# Patient Record
Sex: Female | Born: 1995 | Race: White | Hispanic: No | Marital: Single | Smoking: Never smoker
Health system: Southern US, Community
[De-identification: ages and names within clinical notes are randomized; demographics above are authoritative.]

## PROBLEM LIST (undated history)

## (undated) DIAGNOSIS — Z789 Other specified health status: Secondary | ICD-10-CM

## (undated) HISTORY — PX: TYMPANOSTOMY TUBE PLACEMENT: SHX32

---
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
## 2015-11-13 NOTE — L&D Delivery Note (Signed)
Delivery Note At 4:36 PM a viable and healthy female was delivered via Vaginal, Spontaneous Delivery (Presentation:OA ;ROT  ).  APGAR: 9, 9; weight P .   Placenta status: delivered, intact .  Cord: 3V with the following complications: none.    Anesthesia:  epidural Episiotomy: None Lacerations: None;B Labial Suture Repair: 3.0 vicryl rapide Est. Blood Loss (mL): 300  Mom to postpartum.  Baby to Couplet care / Skin to Skin.  [name], Tami Viviani 10/08/2016, 4:50 PM  [facility]

## 2016-03-28 LAB — OB RESULTS CONSOLE RUBELLA ANTIBODY, IGM: RUBELLA: IMMUNE

## 2016-03-28 LAB — OB RESULTS CONSOLE HIV ANTIBODY (ROUTINE TESTING): HIV: NONREACTIVE

## 2016-03-28 LAB — OB RESULTS CONSOLE HEPATITIS B SURFACE ANTIGEN: HEP B S AG: NEGATIVE

## 2016-03-28 LAB — OB RESULTS CONSOLE GC/CHLAMYDIA
Chlamydia: NEGATIVE
Gonorrhea: NEGATIVE

## 2016-03-28 LAB — OB RESULTS CONSOLE RPR: RPR: NONREACTIVE

## 2016-08-08 LAB — OB RESULTS CONSOLE ANTIBODY SCREEN: ANTIBODY SCREEN: NEGATIVE

## 2016-10-06 ENCOUNTER — Encounter (HOSPITAL_COMMUNITY): Payer: Self-pay | Admitting: *Deleted

## 2016-10-06 ENCOUNTER — Inpatient Hospital Stay (HOSPITAL_COMMUNITY)
Admission: AD | Admit: 2016-10-06 | Discharge: 2016-10-10 | DRG: 775 | Disposition: A | Discharge status: Home / Self Care | Payer: Medicaid Other | Source: Ambulatory Visit | Attending: Obstetrics and Gynecology | Admitting: Obstetrics and Gynecology

## 2016-10-06 ENCOUNTER — Inpatient Hospital Stay (HOSPITAL_COMMUNITY): Payer: Medicaid Other

## 2016-10-06 DIAGNOSIS — N132 Hydronephrosis with renal and ureteral calculous obstruction: Secondary | ICD-10-CM | POA: Diagnosis present

## 2016-10-06 DIAGNOSIS — N200 Calculus of kidney: Secondary | ICD-10-CM | POA: Diagnosis not present

## 2016-10-06 DIAGNOSIS — Z3A36 36 weeks gestation of pregnancy: Secondary | ICD-10-CM

## 2016-10-06 DIAGNOSIS — O26833 Pregnancy related renal disease, third trimester: Secondary | ICD-10-CM | POA: Diagnosis present

## 2016-10-06 DIAGNOSIS — O26893 Other specified pregnancy related conditions, third trimester: Secondary | ICD-10-CM | POA: Diagnosis present

## 2016-10-06 DIAGNOSIS — Z6791 Unspecified blood type, Rh negative: Secondary | ICD-10-CM

## 2016-10-06 DIAGNOSIS — R109 Unspecified abdominal pain: Secondary | ICD-10-CM | POA: Diagnosis present

## 2016-10-06 HISTORY — DX: Other specified health status: Z78.9

## 2016-10-06 LAB — URINALYSIS, ROUTINE W REFLEX MICROSCOPIC
Bilirubin Urine: NEGATIVE
Glucose, UA: NEGATIVE mg/dL
Ketones, ur: NEGATIVE mg/dL
Leukocytes, UA: NEGATIVE
Nitrite: NEGATIVE
Protein, ur: NEGATIVE mg/dL
Specific Gravity, Urine: 1.015 (ref 1.005–1.030)
pH: 7.5 (ref 5.0–8.0)

## 2016-10-06 LAB — GROUP B STREP BY PCR: GROUP B STREP BY PCR: NEGATIVE

## 2016-10-06 LAB — COMPREHENSIVE METABOLIC PANEL WITH GFR
ALT: 15 U/L (ref 14–54)
AST: 20 U/L (ref 15–41)
Albumin: 3.1 g/dL — ABNORMAL LOW (ref 3.5–5.0)
Alkaline Phosphatase: 100 U/L (ref 38–126)
Anion gap: 9 (ref 5–15)
BUN: 11 mg/dL (ref 6–20)
CO2: 21 mmol/L — ABNORMAL LOW (ref 22–32)
Calcium: 8.3 mg/dL — ABNORMAL LOW (ref 8.9–10.3)
Chloride: 108 mmol/L (ref 101–111)
Creatinine, Ser: 0.97 mg/dL (ref 0.44–1.00)
GFR calc Af Amer: >60 mL/min
GFR calc non Af Amer: >60 mL/min
Glucose, Bld: 79 mg/dL (ref 65–99)
Potassium: 3.7 mmol/L (ref 3.5–5.1)
Sodium: 138 mmol/L (ref 135–145)
Total Bilirubin: 0.3 mg/dL (ref 0.3–1.2)
Total Protein: 6.6 g/dL (ref 6.5–8.1)

## 2016-10-06 LAB — CBC
HCT: 35 % — ABNORMAL LOW (ref 36.0–46.0)
Hemoglobin: 11.9 g/dL — ABNORMAL LOW (ref 12.0–15.0)
MCH: 33.1 pg (ref 26.0–34.0)
MCHC: 34 g/dL (ref 30.0–36.0)
MCV: 97.2 fL (ref 78.0–100.0)
Platelets: 204 K/uL (ref 150–400)
RBC: 3.6 MIL/uL — ABNORMAL LOW (ref 3.87–5.11)
RDW: 13 % (ref 11.5–15.5)
WBC: 14.9 K/uL — ABNORMAL HIGH (ref 4.0–10.5)

## 2016-10-06 LAB — URINE MICROSCOPIC-ADD ON

## 2016-10-06 MED ORDER — ONDANSETRON HCL 4 MG/2ML IJ SOLN
4.0000 mg | Freq: Four times a day (QID) | INTRAMUSCULAR | Status: DC
  Administered 2016-10-06 – 2016-10-07 (×4): 4 mg via INTRAVENOUS
  Filled 2016-10-06 (×4): qty 2

## 2016-10-06 MED ORDER — HYDROMORPHONE HCL 1 MG/ML IJ SOLN
1.0000 mg | Freq: Once | INTRAMUSCULAR | Status: AC
  Administered 2016-10-06: 1 mg via INTRAMUSCULAR
  Filled 2016-10-06: qty 1

## 2016-10-06 MED ORDER — HYDROMORPHONE HCL 1 MG/ML IJ SOLN
2.0000 mg | INTRAMUSCULAR | Status: DC | PRN
  Administered 2016-10-06 – 2016-10-07 (×5): 2 mg via INTRAVENOUS
  Filled 2016-10-06 (×5): qty 2

## 2016-10-06 MED ORDER — HYDROMORPHONE HCL 1 MG/ML IJ SOLN
1.0000 mg | INTRAMUSCULAR | Status: DC | PRN
  Administered 2016-10-06: 1 mg via INTRAVENOUS
  Filled 2016-10-06: qty 1

## 2016-10-06 MED ORDER — ACETAMINOPHEN 325 MG PO TABS: 650.0000 mg | ORAL_TABLET | ORAL | Status: DC | PRN

## 2016-10-06 MED ORDER — ZOLPIDEM TARTRATE 5 MG PO TABS
5.0000 mg | ORAL_TABLET | Freq: Every evening | ORAL | Status: DC | PRN
  Administered 2016-10-08: 5 mg via ORAL
  Filled 2016-10-06: qty 1

## 2016-10-06 MED ORDER — LACTATED RINGERS IV SOLN
INTRAVENOUS | Status: DC
  Administered 2016-10-06 – 2016-10-07 (×2): via INTRAVENOUS

## 2016-10-06 NOTE — MAU Note (Addendum)
Pt stated she woke up with a sharp pain in her left lower back that is constant and will not go away. SHe is also c/o cramping and ctx that come and go in her abd. Good fetal movement reported and denies vag bleeding or leaking. Has been vomiting off and on all morning.

## 2016-10-06 NOTE — Progress Notes (Signed)
Patient ID: Tami [name], female   DOB: 04-Oct-1996, 20 y.o.   MRN: [id number] Pt admitted and in room, has been about 2 hours since last dilaudid (1 mg) and starting to hurt again, will increase to 2mg  IV q 3 hours prn.  Had another episode of emesis, but feels hungry now and wants to try to eat something.  Will try clears.  NPO after midnight. Informed pt of plan and that Dr. Retta Dionesahlstedt would evaluate her in AM and discuss options. Her boyfriend has had a stent for kidney stones before so is familiar with the process.

## 2016-10-06 NOTE — MAU Provider Note (Signed)
History     CSN: [id number]  Arrival date and time: 10/06/16 1431   First Provider Initiated Contact with Patient 10/06/16 1632      Chief Complaint  Patient presents with  . Back Pain  . Contractions   Back Pain  This is a new problem. The current episode started today. The problem occurs constantly. The problem is unchanged. The pain is present in the lumbar spine. The pain does not radiate. The pain is at a severity of 9/10. Pertinent negatives include no abdominal pain, dysuria or fever. Risk factors include pregnancy. Treatments tried: tylenol, but vomited after     Past Medical History:  Diagnosis Date  . Medical history non-contributory     Past Surgical History:  Procedure Laterality Date  . TYMPANOSTOMY TUBE PLACEMENT      History reviewed. No pertinent family history.  Social History  Substance Use Topics  . Smoking status: Never Smoker  . Smokeless tobacco: Never Used  . Alcohol use No    Allergies:  Allergies  Allergen Reactions  . Sulfur Rash    Prescriptions Prior to Admission  Medication Sig Dispense Refill Last Dose  . IRON PO Take 1 tablet by mouth daily.   10/05/2016  . Prenatal Vit-Fe Fumarate-FA (MULTIVITAMIN-PRENATAL) 27-0.8 MG TABS tablet Take 1 tablet by mouth daily at 12 noon.   10/05/2016    Review of Systems  Constitutional: Negative for chills and fever.  Gastrointestinal: Positive for nausea and vomiting. Negative for abdominal pain, constipation and diarrhea.  Genitourinary: Positive for flank pain. Negative for dysuria, frequency and urgency.  Musculoskeletal: Positive for back pain.   Physical Exam   Blood pressure 120/80, pulse 78, temperature 97.8 F (36.6 C), temperature source Oral, resp. rate 18.  Physical Exam  Nursing note and vitals reviewed. Constitutional: She is oriented to person, place, and time. She appears well-developed and well-nourished. No distress.  HENT:  Head: Normocephalic.  Cardiovascular: Normal  rate.   Respiratory: Effort normal.  GI: Soft. There is no tenderness. There is no rebound.  Genitourinary:  Genitourinary Comments: CVA tenderness on the left No CVA tenderness on the right.   Neurological: She is alert and oriented to person, place, and time.  Skin: Skin is warm and dry.  Psychiatric: She has a normal mood and affect.    FHT: 135, moderate with 15x15 accels, no decels Toco: some UI  Results for orders placed or performed during the hospital encounter of 10/06/16 (from the past 24 hour(s))  Urinalysis, Routine w reflex microscopic ([facility])     Status: Abnormal   Collection Time: 10/06/16  2:48 PM  Result Value Ref Range   Color, Urine YELLOW YELLOW   APPearance HAZY (A) CLEAR   Specific Gravity, Urine 1.015 1.005 - 1.030   pH 7.5 5.0 - 8.0   Glucose, UA NEGATIVE NEGATIVE mg/dL   Hgb urine dipstick TRACE (A) NEGATIVE   Bilirubin Urine NEGATIVE NEGATIVE   Ketones, ur NEGATIVE NEGATIVE mg/dL   Protein, ur NEGATIVE NEGATIVE mg/dL   Nitrite NEGATIVE NEGATIVE   Leukocytes, UA NEGATIVE NEGATIVE  Urine microscopic-add on     Status: Abnormal   Collection Time: 10/06/16  2:48 PM  Result Value Ref Range   Squamous Epithelial / LPF 0-5 (A) NONE SEEN   WBC, UA 0-5 0 - 5 WBC/hpf   RBC / HPF 0-5 0 - 5 RBC/hpf   Bacteria, UA FEW (A) NONE SEEN  CBC     Status: Abnormal  Collection Time: 10/06/16  4:26 PM  Result Value Ref Range   WBC 14.9 (H) 4.0 - 10.5 K/uL   RBC 3.60 (L) 3.87 - 5.11 MIL/uL   Hemoglobin 11.9 (L) 12.0 - 15.0 g/dL   HCT 14.735.0 (L) 82.936.0 - 56.246.0 %   MCV 97.2 78.0 - 100.0 fL   MCH 33.1 26.0 - 34.0 pg   MCHC 34.0 30.0 - 36.0 g/dL   RDW 13.013.0 86.511.5 - 78.415.5 %   Platelets 204 150 - 400 K/uL  Comprehensive metabolic panel     Status: Abnormal   Collection Time: 10/06/16  4:26 PM  Result Value Ref Range   Sodium 138 135 - 145 mmol/L   Potassium 3.7 3.5 - 5.1 mmol/L   Chloride 108 101 - 111 mmol/L   CO2 21 (L) 22 - 32 mmol/L   Glucose, Bld 79 65 - 99  mg/dL   BUN 11 6 - 20 mg/dL   Creatinine, Ser 6.960.97 0.44 - 1.00 mg/dL   Calcium 8.3 (L) 8.9 - 10.3 mg/dL   Total Protein 6.6 6.5 - 8.1 g/dL   Albumin 3.1 (L) 3.5 - 5.0 g/dL   AST 20 15 - 41 U/L   ALT 15 14 - 54 U/L   Alkaline Phosphatase 100 38 - 126 U/L   Total Bilirubin 0.3 0.3 - 1.2 mg/dL   GFR calc non Af Amer >60 >60 mL/min   GFR calc Af Amer >60 >60 mL/min   Anion gap 9 5 - 15   [place] Renal  Result Date: 10/06/2016 CLINICAL DATA:  Acute onset of left flank pain. Trace hematuria, with minimal bacteria in the urine. Initial encounter. EXAM: RENAL / URINARY TRACT ULTRASOUND COMPLETE COMPARISON:  None. FINDINGS: Right Kidney: Length: 11.3 cm. Echogenicity within normal limits. Moderate right-sided hydronephrosis is noted. No mass visualized. Left Kidney: Length: 11.7 cm. Echogenicity within normal limits. Moderate left-sided hydronephrosis is noted. There appears to be a large 9 mm stone at the proximal left ureter, just below the left ureteropelvic junction. No mass visualized. Bladder: Appears normal for degree of bladder distention. IMPRESSION: Moderate bilateral hydronephrosis noted. Left-sided hydronephrosis appears to be caused by a large obstructing 9 mm stone at the proximal left ureter, just below the left ureteropelvic junction. Right-sided hydronephrosis may reflect hydronephrosis of pregnancy. The patient's symptoms are replicated while scanning the left kidney. Electronically Signed   By: Roanna RaiderJeffery  Chang M.D.   On: 10/06/2016 18:21   MAU Course  Procedures  MDM 1826: D/W Dr. Senaida Oresichardson, will admit to ante 1830: D/W NICU, ok with admission.   Assessment and Plan   1. Nephrolithiasis   2. [redacted] weeks gestation of pregnancy    Admit to antenatal  Tawnya CrookHogan, Heather Donovan 10/06/2016, 4:35 PM

## 2016-10-06 NOTE — Progress Notes (Signed)
Dr. Senaida Oresichardson at bedside let pt eat and give pain medicine in 30 mins.

## 2016-10-06 NOTE — H&P (Signed)
Tami [name] is a 20 y.o. female G1P0 at 5636 3/7 weeks (EDD 10/31/16 by LMP c/[name] 9 week US) presenting for severe left flank pain and nausea/vomiting. Pt states pain started early this AM and has gotten worse over the course of the day.  Renal US shows 9mm obstructing stone in the left ureter just below the UPJ and moderate hydronephrosis.   Prenatal care has been otherwise significant for missed appointments from 9 weeks-21 weeks but regular in her attendance after that point.  Rh negative and received rhogam.    OB History    Gravida Para Term Preterm AB Living   1             SAB TAB Ectopic Multiple Live Births                 Past Medical History:  Diagnosis Date  . Medical history non-contributory    Past Surgical History:  Procedure Laterality Date  . TYMPANOSTOMY TUBE PLACEMENT     Family History: family history is not on file. Social History:  reports that she has never smoked. She has never used smokeless tobacco. She reports that she does not drink alcohol or use drugs.     Maternal Diabetes: No Genetic Screening: not done due to missed appts Maternal Ultrasounds/Referrals: Normal Fetal Ultrasounds or other Referrals:  None Maternal Substance Abuse:  No Significant Maternal Medications:  None Significant Maternal Lab Results:  Lab values include: Rh negative Other Comments:  None  ROS History Dilation: 1 Effacement (%): Thick Station: Ballotable Exam by:: K.WIlosn,RN Blood pressure 120/80, pulse 78, temperature 97.8 F (36.6 C), temperature source Oral, resp. rate 18. Exam Physical Exam  Prenatal labs: ABO, Rh:  O negative Antibody:  negative Rubella:  Immune RPR:   NR HBsAg:   Neg HIV:   NR GBS:   pending Hgb AA Essential panel negative One hour GCT 100  Assessment/Plan: Pt admitted with obstructing left kidney stone 9mm just below the UPJ. Will manage pain and associated N/V with dilaudid and zofran prn.   Will collect GBS as not yet done in  office. D/[name] Dr. Retta Dionesahlstedt of urology and he will consult on patient in AM 10/07/16 to see if will require intervention.  Asks that patient be made NPO after midnight.  Tami [name] 10/06/2016, 6:50 PM

## 2016-10-07 ENCOUNTER — Encounter (HOSPITAL_COMMUNITY): Payer: Self-pay | Admitting: Anesthesiology

## 2016-10-07 ENCOUNTER — Encounter (HOSPITAL_COMMUNITY)
Admission: AD | Disposition: A | Discharge status: Home / Self Care | Payer: Self-pay | Source: Ambulatory Visit | Attending: Obstetrics and Gynecology

## 2016-10-07 ENCOUNTER — Inpatient Hospital Stay (HOSPITAL_COMMUNITY): Payer: Medicaid Other | Admitting: Anesthesiology

## 2016-10-07 HISTORY — PX: CYSTOSCOPY W/ URETERAL STENT PLACEMENT: SHX1429

## 2016-10-07 LAB — CBC
HEMATOCRIT: 32.4 % — AB (ref 36.0–46.0)
Hemoglobin: 11.2 g/dL — ABNORMAL LOW (ref 12.0–15.0)
MCH: 33.3 pg (ref 26.0–34.0)
MCHC: 34.6 g/dL (ref 30.0–36.0)
MCV: 96.4 fL (ref 78.0–100.0)
PLATELETS: 208 10*3/uL (ref 150–400)
RBC: 3.36 MIL/uL — AB (ref 3.87–5.11)
RDW: 13.3 % (ref 11.5–15.5)
WBC: 14.5 10*3/uL — AB (ref 4.0–10.5)

## 2016-10-07 SURGERY — CYSTOSCOPY, WITH RETROGRADE PYELOGRAM AND URETERAL STENT INSERTION
Anesthesia: Spinal | Site: Ureter | Laterality: Left

## 2016-10-07 MED ORDER — HYDROMORPHONE HCL 1 MG/ML IJ SOLN
1.0000 mg | Freq: Once | INTRAMUSCULAR | Status: AC
  Administered 2016-10-07: 1 mg via INTRAVENOUS

## 2016-10-07 MED ORDER — HYDROMORPHONE HCL 1 MG/ML IJ SOLN
INTRAMUSCULAR | Status: AC
  Filled 2016-10-07: qty 1

## 2016-10-07 MED ORDER — CEFAZOLIN SODIUM-DEXTROSE 2-4 GM/100ML-% IV SOLN
2.0000 g | Freq: Three times a day (TID) | INTRAVENOUS | Status: DC
  Administered 2016-10-07: 2 g via INTRAVENOUS
  Filled 2016-10-07 (×3): qty 100

## 2016-10-07 MED ORDER — LACTATED RINGERS IV BOLUS (SEPSIS): 500.0000 mL | Freq: Once | INTRAVENOUS | Status: DC

## 2016-10-07 MED ORDER — IOHEXOL 300 MG/ML  SOLN
INTRAMUSCULAR | Status: DC | PRN
  Administered 2016-10-07: 16:00:00

## 2016-10-07 MED ORDER — ONDANSETRON HCL 4 MG/2ML IJ SOLN
INTRAMUSCULAR | Status: DC | PRN
  Administered 2016-10-07: 4 mg via INTRAVENOUS

## 2016-10-07 MED ORDER — METOCLOPRAMIDE HCL 5 MG/ML IJ SOLN
INTRAMUSCULAR | Status: AC
  Filled 2016-10-07: qty 2

## 2016-10-07 MED ORDER — BUPIVACAINE IN DEXTROSE 0.75-8.25 % IT SOLN
INTRATHECAL | Status: DC | PRN
  Administered 2016-10-07: 1.6 mL via INTRATHECAL

## 2016-10-07 MED ORDER — METOCLOPRAMIDE HCL 5 MG/ML IJ SOLN: 10.0000 mg | Freq: Once | INTRAMUSCULAR | Status: DC | PRN

## 2016-10-07 MED ORDER — HYDROMORPHONE HCL 1 MG/ML IJ SOLN: 0.2500 mg | INTRAMUSCULAR | Status: DC | PRN

## 2016-10-07 MED ORDER — MEPERIDINE HCL 50 MG/ML IJ SOLN
6.2500 mg | INTRAMUSCULAR | Status: DC | PRN
  Filled 2016-10-07: qty 1

## 2016-10-07 MED ORDER — LACTATED RINGERS IV SOLN: INTRAVENOUS | Status: DC

## 2016-10-07 MED ORDER — OXYCODONE HCL 5 MG PO TABS
5.0000 mg | ORAL_TABLET | ORAL | Status: DC | PRN
  Administered 2016-10-07: 5 mg via ORAL
  Filled 2016-10-07: qty 1

## 2016-10-07 MED ORDER — ONDANSETRON HCL 4 MG/2ML IJ SOLN
INTRAMUSCULAR | Status: AC
  Filled 2016-10-07: qty 2

## 2016-10-07 MED ORDER — STERILE WATER FOR IRRIGATION IR SOLN
Status: DC | PRN
  Administered 2016-10-07: 3000 mL

## 2016-10-07 MED ORDER — PHENYLEPHRINE HCL 10 MG/ML IJ SOLN
INTRAMUSCULAR | Status: AC
  Filled 2016-10-07: qty 1

## 2016-10-07 MED ORDER — LACTATED RINGERS IV BOLUS (SEPSIS)
500.0000 mL | Freq: Once | INTRAVENOUS | Status: AC
  Administered 2016-10-07: 500 mL via INTRAVENOUS

## 2016-10-07 MED ORDER — HYDROMORPHONE HCL 2 MG PO TABS
2.0000 mg | ORAL_TABLET | ORAL | Status: DC | PRN
  Administered 2016-10-08 (×3): 2 mg via ORAL
  Filled 2016-10-07 (×3): qty 1

## 2016-10-07 MED ORDER — METOCLOPRAMIDE HCL 5 MG/ML IJ SOLN
INTRAMUSCULAR | Status: DC | PRN
  Administered 2016-10-07: 10 mg via INTRAVENOUS

## 2016-10-07 SURGICAL SUPPLY — 14 items
BAG URO CATCHER STRL LF (MISCELLANEOUS) ×3 IMPLANT
CATH INTERMIT  6FR 70CM (CATHETERS) IMPLANT
CLOTH BEACON ORANGE TIMEOUT ST (SAFETY) ×3 IMPLANT
GLOVE BIOGEL M 8.0 STRL (GLOVE) ×3 IMPLANT
GOWN STRL REUS W/ TWL XL LVL3 (GOWN DISPOSABLE) ×1 IMPLANT
GOWN STRL REUS W/TWL LRG LVL3 (GOWN DISPOSABLE) ×3 IMPLANT
GOWN STRL REUS W/TWL XL LVL3 (GOWN DISPOSABLE) ×3
GUIDEWIRE ANG ZIPWIRE 038X150 (WIRE) IMPLANT
GUIDEWIRE STR DUAL SENSOR (WIRE) ×3 IMPLANT
MANIFOLD NEPTUNE II (INSTRUMENTS) ×3 IMPLANT
PACK CYSTO (CUSTOM PROCEDURE TRAY) ×3 IMPLANT
STENT URET 6FRX24 CONTOUR (STENTS) ×2 IMPLANT
TUBING CONNECTING 10 (TUBING) ×2 IMPLANT
TUBING CONNECTING 10' (TUBING) ×1

## 2016-10-07 NOTE — Progress Notes (Signed)
Spoke with Dr. Senaida Oresichardson. Pt is contracting every 6-7 min. Says she feels them as mild cramping and tightening. FHR tracing is a category 1. No vaginal bleeding or leaking of fluid. Orders received for 500ml  Bolus of LR.

## 2016-10-07 NOTE — Consult Note (Addendum)
Urology Consult   Physician requesting consult: K. Senaida Oresichardson, MD  Reason for consult: Kidney stone  History of Present Illness: Tami [name] is a 20 y.o. female without prior urologic history who was admitted yesterday with flank pain on the left side.  Evaluation included a renal ultrasound which revealed a 9 millimeter left proximal ureteral stone with significant left hydronephrosis.  There was also right hydronephrosis, but the patient has had no pain on that side.  The patient was admitted for pain management.  She still has pain and nausea, but has had no recent fevers or chills.  She is in her 36th week of an otherwise uncomplicated pregnancy.    Past Medical History:  Diagnosis Date  . Medical history non-contributory     Past Surgical History:  Procedure Laterality Date  . TYMPANOSTOMY TUBE PLACEMENT       Current Hospital Medications: Scheduled Meds: . ondansetron (ZOFRAN) IV  4 mg Intravenous Q6H   Continuous Infusions: . lactated ringers 125 mL/hr at 10/07/16 0706   PRN Meds:.acetaminophen, HYDROmorphone, zolpidem  Allergies:  Allergies  Allergen Reactions  . Sulfur Rash    Family History  Problem Relation Age of Onset  . Cancer Paternal Aunt   . Cancer Paternal Grandmother   . Cancer Paternal Grandfather   . Cancer Paternal Aunt     Social History:  reports that she has never smoked. She has never used smokeless tobacco. She reports that she does not drink alcohol or use drugs.  ROS: A complete review of systems was performed.  All systems are negative except for pertinent findings as noted.  Physical Exam:  Vital signs in last 24 hours: Temp:  [97.7 F (36.5 C)-98.3 F (36.8 C)] 98.3 F (36.8 C) (11/26 0314) Pulse Rate:  [63-91] 80 (11/26 0314) Resp:  [18] 18 (11/26 0314) BP: (109-120)/(67-80) 113/69 (11/26 0314) Weight:  [80.7 kg (178 lb)] 80.7 kg (178 lb) (11/25 2010) General:  Alert and oriented, No acute distress HEENT:  Normocephalic, atraumatic Neck: No JVD or lymphadenopathy Lungs: Normal inspiratory and expiratory excursion Abdomen: Gravid, left CVA tenderness Extremities: No edema Neurologic: Grossly intact  Laboratory Data:   Recent Labs  10/06/16 1626 10/07/16 0508  WBC 14.9* 14.5*  HGB 11.9* 11.2*  HCT 35.0* 32.4*  PLT 204 208     Recent Labs  10/06/16 1626  NA 138  K 3.7  CL 108  GLUCOSE 79  BUN 11  CALCIUM 8.3*  CREATININE 0.97     Results for orders placed or performed during the hospital encounter of 10/06/16 (from the past 24 hour(s))  Urinalysis, Routine w reflex microscopic (not at [facility])     Status: Abnormal   Collection Time: 10/06/16  2:48 PM  Result Value Ref Range   Color, Urine YELLOW YELLOW   APPearance HAZY (A) CLEAR   Specific Gravity, Urine 1.015 1.005 - 1.030   pH 7.5 5.0 - 8.0   Glucose, UA NEGATIVE NEGATIVE mg/dL   Hgb urine dipstick TRACE (A) NEGATIVE   Bilirubin Urine NEGATIVE NEGATIVE   Ketones, ur NEGATIVE NEGATIVE mg/dL   Protein, ur NEGATIVE NEGATIVE mg/dL   Nitrite NEGATIVE NEGATIVE   Leukocytes, UA NEGATIVE NEGATIVE  Urine microscopic-add on     Status: Abnormal   Collection Time: 10/06/16  2:48 PM  Result Value Ref Range   Squamous Epithelial / LPF 0-5 (A) NONE SEEN   WBC, UA 0-5 0 - 5 WBC/hpf   RBC / HPF 0-5 0 - 5 RBC/hpf  Bacteria, UA FEW (A) NONE SEEN  CBC     Status: Abnormal   Collection Time: 10/06/16  4:26 PM  Result Value Ref Range   WBC 14.9 (H) 4.0 - 10.5 K/uL   RBC 3.60 (L) 3.87 - 5.11 MIL/uL   Hemoglobin 11.9 (L) 12.0 - 15.0 g/dL   HCT 19.135.0 (L) 47.836.0 - 29.546.0 %   MCV 97.2 78.0 - 100.0 fL   MCH 33.1 26.0 - 34.0 pg   MCHC 34.0 30.0 - 36.0 g/dL   RDW 62.113.0 30.811.5 - 65.715.5 %   Platelets 204 150 - 400 K/uL  Comprehensive metabolic panel     Status: Abnormal   Collection Time: 10/06/16  4:26 PM  Result Value Ref Range   Sodium 138 135 - 145 mmol/L   Potassium 3.7 3.5 - 5.1 mmol/L   Chloride 108 101 - 111 mmol/L   CO2 21 (L) 22  - 32 mmol/L   Glucose, Bld 79 65 - 99 mg/dL   BUN 11 6 - 20 mg/dL   Creatinine, Ser 8.460.97 0.44 - 1.00 mg/dL   Calcium 8.3 (L) 8.9 - 10.3 mg/dL   Total Protein 6.6 6.5 - 8.1 g/dL   Albumin 3.1 (L) 3.5 - 5.0 g/dL   AST 20 15 - 41 U/L   ALT 15 14 - 54 U/L   Alkaline Phosphatase 100 38 - 126 U/L   Total Bilirubin 0.3 0.3 - 1.2 mg/dL   GFR calc non Af Amer >60 >60 mL/min   GFR calc Af Amer >60 >60 mL/min   Anion gap 9 5 - 15  Group B strep by PCR     Status: None   Collection Time: 10/06/16  7:14 PM  Result Value Ref Range   Group B strep by PCR NEGATIVE NEGATIVE  Type and screen [facility]     Status: None (Preliminary result)   Collection Time: 10/06/16  7:57 PM  Result Value Ref Range   ABO/RH(D) O NEG    Antibody Screen POS    Sample Expiration 10/09/2016    Antibody Identification PASSIVELY ACQUIRED ANTI-D    DAT, 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    Unit Number [id number]    Blood Component Type RED CELLS,LR    Unit division 00    Status of Unit ALLOCATED    Transfusion Status OK TO TRANSFUSE    Crossmatch Result COMPATIBLE    Unit Number [id number]    Blood Component Type RED CELLS,LR    Unit division 00    Status of Unit ALLOCATED    Transfusion Status OK TO TRANSFUSE    Crossmatch Result COMPATIBLE   CBC on admission     Status: Abnormal   Collection Time: 10/07/16  5:08 AM  Result Value Ref Range   WBC 14.5 (H) 4.0 - 10.5 K/uL   RBC 3.36 (L) 3.87 - 5.11 MIL/uL   Hemoglobin 11.2 (L) 12.0 - 15.0 g/dL   HCT 34.732.4 (L) 42.536.0 - 95.646.0 %   MCV 96.4 78.0 - 100.0 fL   MCH 33.3 26.0 - 34.0 pg   MCHC 34.6 30.0 - 36.0 g/dL   RDW 38.713.3 56.411.5 - 33.215.5 %   Platelets 208 150 - 400 K/uL   Recent Results (from the past 240 hour(s))  Group B strep by PCR     Status: None   Collection Time: 10/06/16  7:14 PM  Result Value Ref Range Status   Group B strep by PCR NEGATIVE NEGATIVE Final    Renal Function:  Recent Labs  10/06/16 1626  CREATININE 0.97   Estimated Creatinine  Clearance: 100 mL/min (by C-G formula based on SCr of 0.97 mg/dL).  Radiologic Imaging: US Renal  Result Date: 10/06/2016 CLINICAL DATA:  Acute onset of left flank pain. Trace hematuria, with minimal bacteria in the urine. Initial encounter. EXAM: RENAL / URINARY TRACT ULTRASOUND COMPLETE COMPARISON:  None. FINDINGS: Right Kidney: Length: 11.3 cm. Echogenicity within normal limits. Moderate right-sided hydronephrosis is noted. No mass visualized. Left Kidney: Length: 11.7 cm. Echogenicity within normal limits. Moderate left-sided hydronephrosis is noted. There appears to be a large 9 mm stone at the proximal left ureter, just below the left ureteropelvic junction. No mass visualized. Bladder: Appears normal for degree of bladder distention. IMPRESSION: Moderate bilateral hydronephrosis noted. Left-sided hydronephrosis appears to be caused by a large obstructing 9 mm stone at the proximal left ureter, just below the left ureteropelvic junction. Right-sided hydronephrosis may reflect hydronephrosis of pregnancy. The patient's symptoms are replicated while scanning the left kidney. Electronically Signed   By: Roanna Raider M.D.   On: 10/06/2016 18:21    I independently reviewed the above imaging studies.  Impression/Assessment:  Left hydronephrosis with associated left proximal ureteral stone.  36 week intrauterine pregnancy  Plan:  I have discussed management with the patient.  Obviously, the best plan at this point is decompression of the left renal unit on an urgent basis.  Options would include percutaneous drainage of the kidney with external bag for about a month, until she delivers versus cystoscopy and placement of left double-J stent.  Benefits and drawbacks of each of these were discussed with her-external drainage for a month with a tube coming from her left flank.  With her pregnancy, she would be lying on that side and it might be uncomfortable.  Stent placement would be less bothersome  with external drainage, but may increase the patient's bladder symptoms and cause pain from the stent.  I will call the patient back in approximately one to one and half hours.  She will talk with family.  If she decides to go with either of these, I will get that scheduled to be done today.  Meanwhile, the patient will remain nothing by mouth.  Pt has decided on J2 stent.  I would like OB clearance on chart for anesthetic procedure for stent placement     Thanks  Cc: Huel Cote, M.D.

## 2016-10-07 NOTE — Progress Notes (Signed)
Report called to Brayton ElKristin Jones, RN at [facility]. [facility]

## 2016-10-07 NOTE — Progress Notes (Signed)
Discharged patient via [facility], denies any C/O pain or discomfort, mother and boyfriend at bedside prior to leaving.

## 2016-10-07 NOTE — Transfer of Care (Signed)
Immediate Anesthesia Transfer of Care Note  Patient: Tami [name]  Procedure(s) Performed: Procedure(s): CYSTOSCOPY WITH RETROGRADE PYELOGRAM/URETERAL STENT PLACEMENT (Left)  Patient Location: PACU  Anesthesia Type:General  Level of Consciousness:  sedated, patient cooperative and responds to stimulation  Airway & Oxygen Therapy:Patient Spontanous Breathing and Patient connected to face mask oxgen  Post-op Assessment:  Report given to PACU RN and Post -op Vital signs reviewed and stable  Post vital signs:  Reviewed and stable  Last Vitals:  Vitals:   10/07/16 0846 10/07/16 1219  BP:    Pulse:    Resp: 18 16  Temp: 36.7 C 36.8 C    Complications: No apparent anesthesia complications

## 2016-10-07 NOTE — Progress Notes (Signed)
Dr. Malen GauzeFoster in. Okay for pt to [facility], [facility], room 158 by [facility].

## 2016-10-07 NOTE — Anesthesia Preprocedure Evaluation (Addendum)
Anesthesia Evaluation  Patient identified by MRN, date of birth, ID band Patient awake    Reviewed: Allergy & Precautions, NPO status , Patient's Chart, lab work & pertinent test results  Airway Mallampati: II  TM Distance: >3 FB Neck ROM: Full    Dental no notable dental hx. (+) Teeth Intact   Pulmonary neg pulmonary ROS,    Pulmonary exam normal breath sounds clear to auscultation       Cardiovascular negative cardio ROS Normal cardiovascular exam Rhythm:Regular Rate:Normal     Neuro/Psych negative neurological ROS  negative psych ROS   GI/Hepatic negative GI ROS, Neg liver ROS,   Endo/Other    Renal/GU Renal diseaseLarge (9mm) stone left proximal ureter at UPJ   Left ureteral calculus    Musculoskeletal negative musculoskeletal ROS (+)   Abdominal (+) + obese,   Peds  Hematology  (+) anemia ,   Anesthesia Other Findings   Reproductive/Obstetrics (+) Pregnancy 36 weeks                            Lab Results  Component Value Date   WBC 14.5 (H) 10/07/2016   HGB 11.2 (L) 10/07/2016   HCT 32.4 (L) 10/07/2016   MCV 96.4 10/07/2016   PLT 208 10/07/2016     Chemistry      Component Value Date/Time   NA 138 10/06/2016 1626   K 3.7 10/06/2016 1626   CL 108 10/06/2016 1626   CO2 21 (L) 10/06/2016 1626   BUN 11 10/06/2016 1626   CREATININE 0.97 10/06/2016 1626      Component Value Date/Time   CALCIUM 8.3 (L) 10/06/2016 1626   ALKPHOS 100 10/06/2016 1626   AST 20 10/06/2016 1626   ALT 15 10/06/2016 1626   BILITOT 0.3 10/06/2016 1626       Anesthesia Physical Anesthesia Plan  ASA: II and emergent  Anesthesia Plan: Spinal   Post-op Pain Management:    Induction:   Airway Management Planned: Natural Airway and Nasal Cannula  Additional Equipment:   Intra-op Plan:   Post-operative Plan:   Informed Consent: I have reviewed the patients History and Physical, chart,  labs and discussed the procedure including the risks, benefits and alternatives for the proposed anesthesia with the patient or authorized representative who has indicated his/her understanding and acceptance.   Dental advisory given  Plan Discussed with: Anesthesiologist, CRNA and Surgeon  Anesthesia Plan Comments:         Anesthesia Quick Evaluation

## 2016-10-07 NOTE — Progress Notes (Signed)
Spoke with Dr. Senaida Oresichardson. Pt is still having mild uc's that she describes as cramping or tightening. Pt's cervix is 1-2cm, 50% effaced, and vertex is at a -2 station.Pt [facility] have a regular [facility], meds will be po, and she may be d/c home in the am if she is stable.

## 2016-10-07 NOTE — Anesthesia Procedure Notes (Signed)
Spinal  Patient location during procedure: OR Start time: 10/07/2016 3:32 PM Staffing Anesthesiologist: Mal AmabileFOSTER, Illyana Schorsch Preanesthetic Checklist Completed: patient identified, site marked, surgical consent, pre-op evaluation, timeout performed, IV checked, risks and benefits discussed and monitors and equipment checked Spinal Block Patient position: sitting Prep: site prepped and draped and DuraPrep Patient monitoring: heart rate, cardiac monitor, continuous pulse ox and blood pressure Approach: midline Location: L3-4 Injection technique: single-shot Needle Needle type: Sprotte and Pencan  Needle gauge: 24 G Needle length: 9 cm Needle insertion depth: 5 cm Assessment Sensory level: T6 Additional Notes Patient tolerated procedure well. Adequate sensory level.

## 2016-10-07 NOTE — Op Note (Signed)
Preoperative diagnosis: 36 week intrauterine pregnancy with left proximal ureteral stone with associated hydronephrosis  Postoperative diagnosis: Same  Principal procedure: Cystoscopy, left retrograde ureteropyelogram with fluoroscopic interpretation, placement of 6 French by 24 centimeter contour double-J stent without tether  Surgeon: Merilee Wible  Anesthesia: Subarachnoid block  Complications: None  Drains: 24 centimeter 6 French contour double-J stent.  Specimens: None  Estimated blood loss: None  Indications: 20 year old female in her first pregnancy, at 36 weeks.  Thus far, except for this stone which presented yesterday, her pregnancy has been uncomplicated.  Presentation yesterday to [facility] with evaluation including a renal ultrasound, revealing a 9 millimeter left proximal ureteral stone with associated left hydronephrosis.  The patient has had persistent nausea and pain.  This required hospitalization.  Urologic consultation was performed, and I recommended cystoscopy and stent placement, with definitive management of her stone following delivery.  Risks and complications of stent placement were discussed with the patient.  She understands these and desires to proceed.  Description of procedure: The patient was properly identified and marked in the holding area.  She was then taken to the operating room.  The patient was administered a spinal block by anesthesia.  She was placed in the dorsolithotomy position.  Genitalia and perineum were prepped and draped.  Proper timeout was performed.  A 23 French panendoscope was advanced into her bladder.  Bladder was inspected circumferentially.  No lesions were noted, ureteral orifices were normal in configuration and location.  A 6 French open-ended catheter was advanced in the left ureteral orifice.  Omnipaque was used for gentle retrograde ureteropyelogram.  This showed a normal ureter throughout, but there was  a filling defect at the UPJ consistent with the previously mentioned stone.  Pyelo-calyceal system was dilated, but otherwise normal.  A 0.038 inch sensor-tip guidewire was advanced through the open-ended catheter and up into the left upper pole calyceal system, past the stone.  Once the guidewire was  adequately positioned, the open-ended catheter was removed.  I then passed a 24 centimeter by 6 [place]French contour double-J stent over top of the guidewire using fluoroscopic and cystoscopic guidance.  The guidewire was then removed, adequate  proximal and distal curls were seen using fluoroscopy and cystoscopy, respectively.  At this point, the scope was removed following the bladder being empty.   The patient was then transported the PACU in stable condition, having tolerated the procedure well.

## 2016-10-07 NOTE — Progress Notes (Addendum)
Patient ID: Tami [name], female   DOB: [birth year], 20 y.o.   MRN: [id number] Pt reports continued pain and N/V overnight.  Good FM, no other issues.  afeb vss FHR category 1 but lots of maternal tracing, will repeat NST prior to procedure to confirm reassuring  abd gravid NT Flank pain on left unchanged  WBC 14.5 and stable  36 4/7 weeks with left obstructing renal stone for stent placement later today  Pt is clear for anesthesia for stent and the Rapid Response [facility] post-procedure. d/w Dr. Malen GauzeFoster placing wedge to displace uterus in OR.   [facility] and hopefully with sx improvement be able to transition to po meds.

## 2016-10-07 NOTE — Anesthesia Postprocedure Evaluation (Signed)
Anesthesia Post Note  Patient: Tami [name]  Procedure(s) Performed: Procedure(s) (LRB): CYSTOSCOPY WITH RETROGRADE PYELOGRAM/URETERAL STENT PLACEMENT (Left)  Patient location during evaluation: PACU Anesthesia Type: Spinal Level of consciousness: awake and alert and oriented Pain management: pain level controlled Vital Signs Assessment: post-procedure vital signs reviewed and stable Respiratory status: spontaneous breathing, nonlabored ventilation and respiratory function stable Cardiovascular status: blood pressure returned to baseline and stable Postop Assessment: no headache, no signs of nausea or vomiting, spinal receding and no backache Anesthetic complications: no    Last Vitals:  Vitals:   10/07/16 1645 10/07/16 1700  BP: 105/79 107/71  Pulse: 68 69  Resp: 12 10  Temp:  36.7 C    Last Pain:  Vitals:   10/07/16 1637  TempSrc:   PainSc: 4                  Female Minish A.

## 2016-10-07 NOTE — Progress Notes (Deleted)
Advised Nurse, Brayton ElKristin Jones, at [facility], that patient had Spinal and extreme . [facility].

## 2016-10-07 NOTE — Progress Notes (Signed)
Advised Nurse, Brayton Elkristin Jones, RN, at [facility] that Extreme caution should be used with ambulation due to spinal anesthesia.

## 2016-10-07 NOTE — Progress Notes (Signed)
Hazel SamsMary Early, RN/ [facility] nurse from [facility] at bedside monitoring fetal status.

## 2016-10-08 ENCOUNTER — Inpatient Hospital Stay (HOSPITAL_COMMUNITY): Payer: Medicaid Other | Admitting: Anesthesiology

## 2016-10-08 ENCOUNTER — Encounter (HOSPITAL_COMMUNITY): Payer: Self-pay | Admitting: Urology

## 2016-10-08 LAB — URINE CULTURE

## 2016-10-08 LAB — CULTURE, BETA STREP (GROUP B ONLY)

## 2016-10-08 LAB — CBC
HEMATOCRIT: 35.3 % — AB (ref 36.0–46.0)
HEMOGLOBIN: 12.2 g/dL (ref 12.0–15.0)
MCH: 33.4 pg (ref 26.0–34.0)
MCHC: 34.6 g/dL (ref 30.0–36.0)
MCV: 96.7 fL (ref 78.0–100.0)
Platelets: 226 10*3/uL (ref 150–400)
RBC: 3.65 MIL/uL — AB (ref 3.87–5.11)
RDW: 13.2 % (ref 11.5–15.5)
WBC: 15.6 10*3/uL — ABNORMAL HIGH (ref 4.0–10.5)

## 2016-10-08 LAB — OB RESULTS CONSOLE GBS: STREP GROUP B AG: POSITIVE

## 2016-10-08 LAB — AMNISURE RUPTURE OF MEMBRANE (ROM) NOT AT ARMC: Amnisure ROM: POSITIVE

## 2016-10-08 MED ORDER — DIPHENHYDRAMINE HCL 25 MG PO CAPS: 25.0000 mg | ORAL_CAPSULE | Freq: Four times a day (QID) | ORAL | Status: DC | PRN

## 2016-10-08 MED ORDER — ACETAMINOPHEN 325 MG PO TABS: 650.0000 mg | ORAL_TABLET | ORAL | Status: DC | PRN

## 2016-10-08 MED ORDER — EPHEDRINE 5 MG/ML INJ
10.0000 mg | INTRAVENOUS | Status: DC | PRN
  Filled 2016-10-08: qty 4

## 2016-10-08 MED ORDER — PROMETHAZINE HCL 25 MG/ML IJ SOLN: 12.5000 mg | Freq: Once | INTRAMUSCULAR | Status: DC

## 2016-10-08 MED ORDER — LIDOCAINE HCL (PF) 1 % IJ SOLN
INTRAMUSCULAR | Status: DC | PRN
  Administered 2016-10-08: 4 mL
  Administered 2016-10-08: 6 mL via EPIDURAL

## 2016-10-08 MED ORDER — OXYCODONE HCL 5 MG PO TABS: 5.0000 mg | ORAL_TABLET | ORAL | Status: DC | PRN

## 2016-10-08 MED ORDER — FLEET ENEMA 7-19 GM/118ML RE ENEM: 1.0000 | ENEMA | RECTAL | Status: DC | PRN

## 2016-10-08 MED ORDER — PENICILLIN G POT IN DEXTROSE 60000 UNIT/ML IV SOLN
3.0000 10*6.[IU] | INTRAVENOUS | Status: DC
  Administered 2016-10-08: 3 10*6.[IU] via INTRAVENOUS
  Filled 2016-10-08 (×3): qty 50

## 2016-10-08 MED ORDER — OXYTOCIN 40 UNITS IN LACTATED RINGERS INFUSION - SIMPLE MED
2.5000 [IU]/h | INTRAVENOUS | Status: DC
  Filled 2016-10-08: qty 1000

## 2016-10-08 MED ORDER — LACTATED RINGERS IV SOLN: 500.0000 mL | INTRAVENOUS | Status: DC | PRN

## 2016-10-08 MED ORDER — OXYTOCIN BOLUS FROM INFUSION
500.0000 mL | Freq: Once | INTRAVENOUS | Status: AC
  Administered 2016-10-08: 500 mL via INTRAVENOUS

## 2016-10-08 MED ORDER — ONDANSETRON HCL 4 MG/2ML IJ SOLN: 4.0000 mg | Freq: Four times a day (QID) | INTRAMUSCULAR | Status: DC | PRN

## 2016-10-08 MED ORDER — PENICILLIN G POTASSIUM 5000000 UNITS IJ SOLR
5.0000 10*6.[IU] | Freq: Once | INTRAVENOUS | Status: AC
  Administered 2016-10-08: 5 10*6.[IU] via INTRAVENOUS
  Filled 2016-10-08: qty 5

## 2016-10-08 MED ORDER — PHENYLEPHRINE 40 MCG/ML (10ML) SYRINGE FOR IV PUSH (FOR BLOOD PRESSURE SUPPORT)
80.0000 ug | PREFILLED_SYRINGE | INTRAVENOUS | Status: DC | PRN
  Filled 2016-10-08: qty 5

## 2016-10-08 MED ORDER — LIDOCAINE HCL (PF) 1 % IJ SOLN
30.0000 mL | INTRAMUSCULAR | Status: DC | PRN
  Filled 2016-10-08: qty 30

## 2016-10-08 MED ORDER — OXYCODONE HCL 5 MG PO TABS
10.0000 mg | ORAL_TABLET | ORAL | Status: DC | PRN
  Administered 2016-10-08 – 2016-10-09 (×3): 10 mg via ORAL
  Filled 2016-10-08 (×3): qty 2

## 2016-10-08 MED ORDER — WITCH HAZEL-GLYCERIN EX PADS: 1.0000 "application " | MEDICATED_PAD | CUTANEOUS | Status: DC | PRN

## 2016-10-08 MED ORDER — ONDANSETRON HCL 4 MG PO TABS
4.0000 mg | ORAL_TABLET | ORAL | Status: DC | PRN
Start: 2016-10-08 — End: 2016-10-10

## 2016-10-08 MED ORDER — PROMETHAZINE HCL 25 MG RE SUPP
12.5000 mg | Freq: Four times a day (QID) | RECTAL | Status: DC | PRN
  Administered 2016-10-08: 12.5 mg via RECTAL
  Filled 2016-10-08: qty 1

## 2016-10-08 MED ORDER — PHENYLEPHRINE 40 MCG/ML (10ML) SYRINGE FOR IV PUSH (FOR BLOOD PRESSURE SUPPORT)
80.0000 ug | PREFILLED_SYRINGE | INTRAVENOUS | Status: DC | PRN
  Filled 2016-10-08: qty 10
  Filled 2016-10-08: qty 5

## 2016-10-08 MED ORDER — SENNOSIDES-DOCUSATE SODIUM 8.6-50 MG PO TABS
2.0000 | ORAL_TABLET | ORAL | Status: DC
  Administered 2016-10-08 – 2016-10-10 (×2): 2 via ORAL
  Filled 2016-10-08 (×2): qty 2

## 2016-10-08 MED ORDER — COCONUT OIL OIL: 1.0000 "application " | TOPICAL_OIL | Status: DC | PRN

## 2016-10-08 MED ORDER — LACTATED RINGERS IV SOLN: INTRAVENOUS | Status: DC

## 2016-10-08 MED ORDER — OXYCODONE-ACETAMINOPHEN 5-325 MG PO TABS: 1.0000 | ORAL_TABLET | ORAL | Status: DC | PRN

## 2016-10-08 MED ORDER — TETANUS-DIPHTH-ACELL PERTUSSIS 5-2.5-18.5 LF-MCG/0.5 IM SUSP: 0.5000 mL | Freq: Once | INTRAMUSCULAR | Status: DC

## 2016-10-08 MED ORDER — SIMETHICONE 80 MG PO CHEW: 80.0000 mg | CHEWABLE_TABLET | ORAL | Status: DC | PRN

## 2016-10-08 MED ORDER — LACTATED RINGERS IV SOLN
500.0000 mL | Freq: Once | INTRAVENOUS | Status: AC
  Administered 2016-10-08: 500 mL via INTRAVENOUS

## 2016-10-08 MED ORDER — IBUPROFEN 600 MG PO TABS
600.0000 mg | ORAL_TABLET | Freq: Four times a day (QID) | ORAL | Status: DC
  Administered 2016-10-08 – 2016-10-10 (×8): 600 mg via ORAL
  Filled 2016-10-08 (×8): qty 1

## 2016-10-08 MED ORDER — OXYCODONE-ACETAMINOPHEN 5-325 MG PO TABS: 2.0000 | ORAL_TABLET | ORAL | Status: DC | PRN

## 2016-10-08 MED ORDER — PRENATAL MULTIVITAMIN CH
1.0000 | ORAL_TABLET | Freq: Every day | ORAL | Status: DC
  Administered 2016-10-09 – 2016-10-10 (×2): 1 via ORAL
  Filled 2016-10-08 (×2): qty 1

## 2016-10-08 MED ORDER — LACTATED RINGERS IV SOLN
INTRAVENOUS | Status: DC
  Administered 2016-10-08: 12:00:00 via INTRAVENOUS

## 2016-10-08 MED ORDER — DIPHENHYDRAMINE HCL 50 MG/ML IJ SOLN: 12.5000 mg | INTRAMUSCULAR | Status: DC | PRN

## 2016-10-08 MED ORDER — ONDANSETRON HCL 4 MG/2ML IJ SOLN: 4.0000 mg | INTRAMUSCULAR | Status: DC | PRN

## 2016-10-08 MED ORDER — BENZOCAINE-MENTHOL 20-0.5 % EX AERO
1.0000 "application " | INHALATION_SPRAY | CUTANEOUS | Status: DC | PRN
  Administered 2016-10-08: 1 via TOPICAL
  Filled 2016-10-08: qty 56

## 2016-10-08 MED ORDER — SOD CITRATE-CITRIC ACID 500-334 MG/5ML PO SOLN: 30.0000 mL | ORAL | Status: DC | PRN

## 2016-10-08 MED ORDER — DIBUCAINE 1 % RE OINT: 1.0000 "application " | TOPICAL_OINTMENT | RECTAL | Status: DC | PRN

## 2016-10-08 MED ORDER — FENTANYL 2.5 MCG/ML BUPIVACAINE 1/10 % EPIDURAL INFUSION (WH - ANES)
14.0000 mL/h | INTRAMUSCULAR | Status: DC | PRN
  Administered 2016-10-08: 14 mL/h via EPIDURAL
  Filled 2016-10-08: qty 100

## 2016-10-08 MED ORDER — ZOLPIDEM TARTRATE 5 MG PO TABS: 5.0000 mg | ORAL_TABLET | Freq: Every evening | ORAL | Status: DC | PRN

## 2016-10-08 NOTE — Progress Notes (Signed)
Patient ID: Tami [name], female   DOB: September 08, 1996, 20 y.o.   MRN: [id number]   SROM at 10:45, clear fluid amnisure positive  SVE per RN 4cm  Transfer to L&D, may get eppidural Treat for PCN+, expect SVD

## 2016-10-08 NOTE — Progress Notes (Signed)
CRITICAL VALUE ALERT  Critical value received:  GBS Positive  Date of notification:  10/08/16  Time of notification:  1105  Critical value read back: yes  Nurse who received alert:  Shantanique Hodo MD notified (1st page):  1106 Dr. Ellyn HackBovard  Time of first page:  1106  MD notified (2nd page):  Time of second page:  Responding MD:  Dr. Ellyn HackBovard  Time MD responded:  1106

## 2016-10-08 NOTE — Anesthesia Preprocedure Evaluation (Addendum)
Anesthesia Evaluation  Patient identified by MRN, date of birth, ID band Patient awake    Reviewed: Allergy & Precautions, NPO status , Patient's Chart, lab work & pertinent test results  Airway Mallampati: II  TM Distance: >3 FB Neck ROM: Full    Dental no notable dental hx. (+) Teeth Intact   Pulmonary neg pulmonary ROS,    Pulmonary exam normal breath sounds clear to auscultation       Cardiovascular negative cardio ROS Normal cardiovascular exam Rhythm:Regular Rate:Normal     Neuro/Psych negative neurological ROS  negative psych ROS   GI/Hepatic negative GI ROS, Neg liver ROS,   Endo/Other    Renal/GU Renal diseaseLarge (9mm) stone left proximal ureter at UPJ   Left ureteral calculus    Musculoskeletal negative musculoskeletal ROS (+)   Abdominal (+) - obese,   Peds  Hematology  (+) anemia ,   Anesthesia Other Findings   Reproductive/Obstetrics (+) Pregnancy 36 weeks                            Lab Results  Component Value Date   WBC 15.6 (H) 10/08/2016   HGB 12.2 10/08/2016   HCT 35.3 (L) 10/08/2016   MCV 96.7 10/08/2016   PLT 226 10/08/2016     Chemistry      Component Value Date/Time   NA 138 10/06/2016 1626   K 3.7 10/06/2016 1626   CL 108 10/06/2016 1626   CO2 21 (L) 10/06/2016 1626   BUN 11 10/06/2016 1626   CREATININE 0.97 10/06/2016 1626      Component Value Date/Time   CALCIUM 8.3 (L) 10/06/2016 1626   ALKPHOS 100 10/06/2016 1626   AST 20 10/06/2016 1626   ALT 15 10/06/2016 1626   BILITOT 0.3 10/06/2016 1626       Anesthesia Physical  Anesthesia Plan  ASA: II  Anesthesia Plan: Epidural   Post-op Pain Management:    Induction:   Airway Management Planned: Natural Airway and Nasal Cannula  Additional Equipment:   Intra-op Plan:   Post-operative Plan:   Informed Consent: I have reviewed the patients History and Physical, chart, labs and  discussed the procedure including the risks, benefits and alternatives for the proposed anesthesia with the patient or authorized representative who has indicated his/her understanding and acceptance.   Dental advisory given  Plan Discussed with: Anesthesiologist, CRNA and Surgeon  Anesthesia Plan Comments:         Anesthesia Quick Evaluation

## 2016-10-08 NOTE — Anesthesia Procedure Notes (Signed)

## 2016-10-08 NOTE — Progress Notes (Signed)
Patient ID: Tami [name], female   DOB: 09-29-1996, 20 y.o.   MRN: [id number]   36+ with kidney stone, s/p stent placement.   +FM, no LOF, no VB, occ ctx.  States pain in back is better, but now more pressure in front.  Had an episode of vomiting this AM.  Possible d/c at lunch will try to get pain/nausea better controlled  AFVSS gen NAD FHTs 130-140's, god var, category 1 Abd soft, NT  Doing well s/p stent plcmt - with some continued pain and nausea Will add phenergan Continued oral meds for now.

## 2016-10-08 NOTE — Anesthesia Postprocedure Evaluation (Signed)
Anesthesia Post Note  Patient: Tami [name]  Procedure(s) Performed: * No procedures listed *  Patient location during evaluation: Mother Baby Anesthesia Type: Epidural Level of consciousness: awake and alert and oriented Pain management: satisfactory to patient Vital Signs Assessment: post-procedure vital signs reviewed and stable Respiratory status: spontaneous breathing and nonlabored ventilation Cardiovascular status: stable Postop Assessment: no headache, no backache, no signs of nausea or vomiting, adequate PO intake and patient able to bend at knees (patient up walking) Anesthetic complications: no     Last Vitals:  Vitals:   10/08/16 1830 10/08/16 1927  BP: 121/75 120/78  Pulse: (!) 59 80  Resp: 16 18  Temp: 37.3 C 37.2 C    Last Pain:  Vitals:   10/08/16 2034  TempSrc:   PainSc: 9    Pain Goal: Patients Stated Pain Goal: 2 (10/08/16 1923)               Madison HickmanGREGORY,[name]

## 2016-10-08 NOTE — Plan of Care (Signed)
Problem: Education: Goal: Knowledge of [facility] Education information/materials will improve Reviewed admission paperwork, policies, procedures and baby and me booklet. Patient and support verbalize understanding.

## 2016-10-08 NOTE — Anesthesia Pain Management Evaluation Note (Signed)
  CRNA Pain Management Visit Note  Patient: Tami [name], 20 y.o., female  "Hello I am a [facility] of the anesthesia team [facility]. We have an anesthesia team available at all times to provide care throughout the hospital, including epidural management and anesthesia for C-section. I don't know your plan for the delivery whether it a natural birth, water birth, IV sedation, nitrous supplementation, doula or epidural, but we want to meet your pain goals."   1.Was your pain managed to your expectations on prior hospitalizations?   No prior hospitalizations  2.What is your expectation for pain management during this hospitalization?     Epidural  3.How can we help you reach that goal? Epidural in situ.  Record the patient's initial score and the patient's pain goal.   Pain: 0 (pressure felt but no pain)  Pain Goal: 5 [facility] wants you to be able to say your pain was always managed very well.  Dereona Kolodny L 10/08/2016

## 2016-10-09 LAB — CBC
HEMATOCRIT: 28.9 % — AB (ref 36.0–46.0)
HEMOGLOBIN: 9.9 g/dL — AB (ref 12.0–15.0)
MCH: 33 pg (ref 26.0–34.0)
MCHC: 34.3 g/dL (ref 30.0–36.0)
MCV: 96.3 fL (ref 78.0–100.0)
Platelets: 181 10*3/uL (ref 150–400)
RBC: 3 MIL/uL — ABNORMAL LOW (ref 3.87–5.11)
RDW: 13.2 % (ref 11.5–15.5)
WBC: 11.8 10*3/uL — AB (ref 4.0–10.5)

## 2016-10-09 LAB — RPR: RPR Ser Ql: NONREACTIVE

## 2016-10-09 NOTE — Lactation Note (Signed)
This note was copied from a baby's chart. Lactation Consultation Note  Patient Name: Tami [name] [id number]'s Date: 10/09/2016 Reason for consult: Follow-up assessment Baby at 28 hr of life. FOB called because mom was latching baby. Upon entry baby was at the breast asleep. Mom tried for several minutes to latch baby. Baby was rooting. Mom was holding baby in a awkward cradle position and baby was not able to maintain suction. Parents report that baby has been latching "fine" and "staying on for a long time". Mom declined latch help, she stated that baby just was not hungry this time and she will call when baby is ready to feed again. Suggested that parents spoon feed the expressed milk. Mom offered 1ml then stated that baby was done. Encouraged parents to offer expressed milk per LPT volume guidelines. Mom seems resistant to lactation help.    Maternal Data Has patient been taught Hand Expression?: Yes Does the patient have breastfeeding experience prior to this delivery?: No  Feeding Feeding Type: Breast Fed Length of feed: 0 min  LATCH Score/Interventions Latch: Too sleepy or reluctant, no latch achieved, no sucking elicited. Intervention(s): Waking techniques  Audible Swallowing: None  Type of Nipple: Everted at rest and after stimulation  Comfort (Breast/Nipple): Soft / non-tender     Hold (Positioning): No assistance needed to correctly position infant at breast.  LATCH Score: 6  Lactation Tools Discussed/Used     Consult Status Consult Status: Follow-up Date: 10/10/16 Follow-up type: In-patient    Tami [name] 10/09/2016, 9:23 PM

## 2016-10-09 NOTE — Progress Notes (Signed)
PPD #1 No problems, sore Afeb, VSS Fundus firm, NT at U-1 Continue routine postpartum care  

## 2016-10-09 NOTE — Progress Notes (Signed)
Mother encouraged to use DEBP every 3 hrs for 15 min. Asked to call out for help if necessary. Explained to parents late preterm behaviors and need for " dessert" or supplementation after each breast feeding.

## 2016-10-09 NOTE — Lactation Note (Signed)
This note was copied from a baby's chart. Lactation Consultation Note  Patient Name: Tami [name] Today's Date: 10/09/2016 Reason for consult: Initial assessment Baby at 27 hr of life. Baby born at 36+5. To a 6169yr P1. Mom is aware of the LPT feeding policy but is not following it. She thinks that baby is bf well. She was told by a RN that "because baby is latching so much, I do not need to pump". Encouraged mom to latch baby on demand q3hr around the clock, post pump, and supplement per LPT volume guidelines. Mom was too busy looking/talking to baby to pay attention to lactation but FOB voiced understanding. Left lactation number on the white board for mom to call at the next feeding.   Maternal Data Has patient been taught Hand Expression?: Yes Does the patient have breastfeeding experience prior to this delivery?: No  Feeding    LATCH Score/Interventions                      Lactation Tools Discussed/Used     Consult Status Consult Status: Follow-up Date: 10/10/16 Follow-up type: In-patient    Tami [name] 10/09/2016, 8:35 PM

## 2016-10-09 NOTE — Plan of Care (Signed)
Problem: Coping: Goal: Ability to cope will improve Outcome: Completed/Met Date Met: 10/09/16 Patient interacting well with baby and seems comfortable with infant care. Patient has very good support from multiple family members and significant other.  Problem: Urinary Elimination: Goal: Ability to reestablish a normal urinary elimination pattern will improve Outcome: Completed/Met Date Met: 10/09/16 Patient ambulating and voiding independently

## 2016-10-10 LAB — TYPE AND SCREEN
ABO/RH(D): O NEG
Antibody Screen: POSITIVE
DAT, IgG: NEGATIVE
UNIT DIVISION: 0
Unit division: 0

## 2016-10-10 MED ORDER — IBUPROFEN 600 MG PO TABS: 600.0000 mg | ORAL_TABLET | Freq: Four times a day (QID) | ORAL | 1 refills | Status: AC | PRN

## 2016-10-10 NOTE — Discharge Instructions (Signed)
Nothing in vagina for 6 weeks.  No sex, tampons, and douching.  Other instructions as in [facility] Discharge Booklet. °

## 2016-10-10 NOTE — Progress Notes (Signed)
Mom request to switch to bottle. Educated and mother still request bottle. Bottle given. Royston CowperIsley, Lajuane Leatham E, RN

## 2016-10-10 NOTE — Lactation Note (Signed)
This note was copied from a baby's chart. Lactation Consultation Note  Patient Name: Tami [name] [id number] Date: 10/10/2016 Reason for consult: Follow-up assessment;Other (Comment) (7 % weight loss, after BF discussion, per mom I don't think I'm going to continue to breastfeed )  Randel Books is 20 hours old and had bottles all night long , last feeding at 0900 35 ml. LC reviewed engorgement and tx if she changes her mind to breast feed or if she exclusively bottle feeds how to dry up milk with cold cabbage leaves.   Maternal Data    Feeding Feeding Type: Bottle Fed - Formula  LATCH Score/Interventions                      Lactation Tools Discussed/Used Tools: Pump (reminded mom to take DEBP kit home in case she changes her mind ) Breast pump type: Double-Electric Breast Pump   Consult Status Consult Status: Complete Date: 10/10/16    Myer Haff 10/10/2016, 10:57 AM

## 2016-10-10 NOTE — Discharge Summary (Signed)
OB Discharge Summary     Patient Name: Tami [name] DOB: 1996-06-17 MRN: [id number]  Date of admission: 10/06/2016 Delivering MD: Sherian ReinBOVARD-STUCKERT, JODY   Date of discharge: 10/10/2016  Admitting diagnosis: 36 WKS, BACK, SIDE PAIN, VOMITING LEFT URETERAL STONE Intrauterine pregnancy: 1144w5d     Secondary diagnosis:  Principal Problem:   SVD (spontaneous vaginal delivery) Active Problems:   Kidney stone complicating pregnancy, third trimester  Additional problems: none     Discharge diagnosis: Preterm Pregnancy Delivered                                                                                                Post partum procedures:none  Augmentation: none  Complications: None  Hospital course:  Onset of Labor With Vaginal Delivery     20 y.o. yo G1P0101 at 4244w5d was admitted in Active Labor on 10/06/2016. Patient had an uncomplicated labor course as follows:  Membrane Rupture Time/Date: 10:45 AM ,10/08/2016   Intrapartum Procedures: Episiotomy: None [1]                                         Lacerations:  None [1];Labial [10]  Patient had a delivery of a Viable infant. 10/08/2016  Information for the patient's newborn:  Chandra BatchBrandenburg, Girl Abriana [[id number]][[id number]]  Delivery Method: Vag-Spont    Pateint had an uncomplicated postpartum course.  She is ambulating, tolerating a regular diet, passing flatus, and urinating well. Patient is discharged home in stable condition on 10/10/16.    Physical exam Vitals:   10/08/16 2330 10/09/16 0745 10/09/16 1740 10/10/16 0527  BP: (!) 98/58 (!) 96/47 99/60 109/66  Pulse: 85 (!) 59 70 65  Resp: 18 16 16 18   Temp: 98.5 F (36.9 C) 97.8 F (36.6 C) 97.5 F (36.4 C) 98.9 F (37.2 C)  TempSrc: Oral Oral Axillary Oral  SpO2:      Weight:      Height:       General: alert, cooperative and no distress Lochia: appropriate Uterine Fundus: firm Incision: N/A DVT Evaluation: No evidence of DVT seen on physical  exam. Labs: Lab Results  Component Value Date   WBC 11.8 (H) 10/09/2016   HGB 9.9 (L) 10/09/2016   HCT 28.9 (L) 10/09/2016   MCV 96.3 10/09/2016   PLT 181 10/09/2016   CMP Latest Ref Rng & Units 10/06/2016  Glucose 65 - 99 mg/dL 79  BUN 6 - 20 mg/dL 11  Creatinine 7.820.44 - 9.561.00 mg/dL 2.130.97  Sodium 086135 - 578145 mmol/L 138  Potassium 3.5 - 5.1 mmol/L 3.7  Chloride 101 - 111 mmol/L 108  CO2 22 - 32 mmol/L 21(L)  Calcium 8.9 - 10.3 mg/dL 8.3(L)  Total Protein 6.5 - 8.1 g/dL 6.6  Total Bilirubin 0.3 - 1.2 mg/dL 0.3  Alkaline Phos 38 - 126 U/L 100  AST 15 - 41 U/L 20  ALT 14 - 54 U/L 15    Discharge instruction: per After Visit Summary and "Baby and Me Booklet".  After visit  meds:    Medication List    TAKE these medications   ibuprofen 600 MG tablet Commonly known as:  ADVIL,MOTRIN Take 1 tablet (600 mg total) by mouth every 6 (six) hours as needed.   IRON PO Take 1 tablet by mouth daily.   multivitamin-prenatal 27-0.8 MG Tabs tablet Take 1 tablet by mouth daily at 12 noon.       Diet: routine diet  Activity: Advance as tolerated. Pelvic rest for 6 weeks.   Outpatient follow up:6 weeks Follow up Appt:No future appointments. Follow up Visit:No Follow-up on file.  Postpartum contraception: Undecided  Newborn Data: Live born female  Birth Weight: 7 lb 13.2 oz (3550 g) APGAR: 9, 9  Baby Feeding: Bottle Disposition:home with mother   10/10/2016 Sharol Givenecilia Worema Daisean Brodhead, DO

## 2016-10-10 NOTE — Progress Notes (Signed)
Patient ID: Tami [name], female   DOB: 08-15-1996, 20 y.o.   MRN: [id number] Pt doing well with no complaints. Pain controlled. No fever or chills. Ready for discharge to home today. Bottlefeeding.  VSS ABD- soft, FF EXT - no edema  A/P: PPD#2 s/p svd and on day 2 s/p cystoscopy -stable         Discharge instructions reviewed         F/u in 6weeks

## 2017-08-22 IMAGING — US US RENAL
1 series · 15 of 25 positions shown · non-contrast
Comparison: None.

CLINICAL DATA: Acute onset of left flank pain. Trace hematuria,
with minimal bacteria in the urine. Initial encounter.

EXAM:
RENAL / URINARY TRACT ULTRASOUND COMPLETE

[Series 1: us renal · 53 acquisitions, 15 frames shown]
[im 1/53]
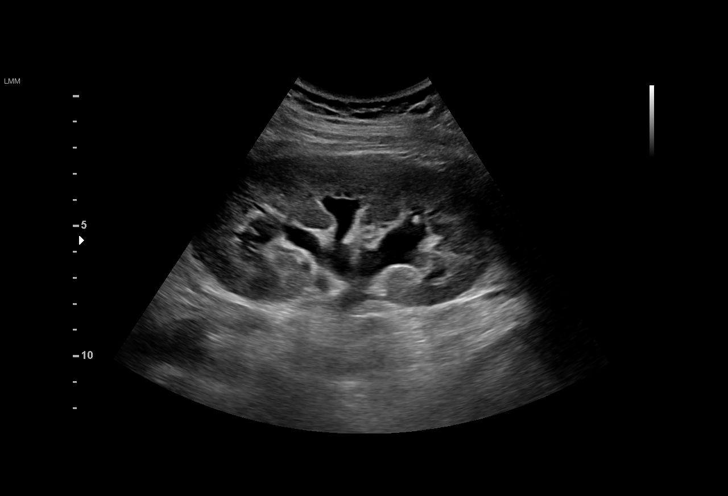
[im 5/53]
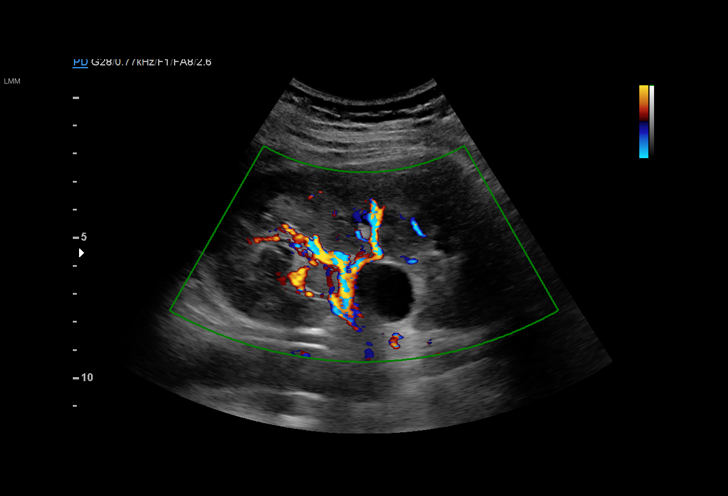
[im 9/53]
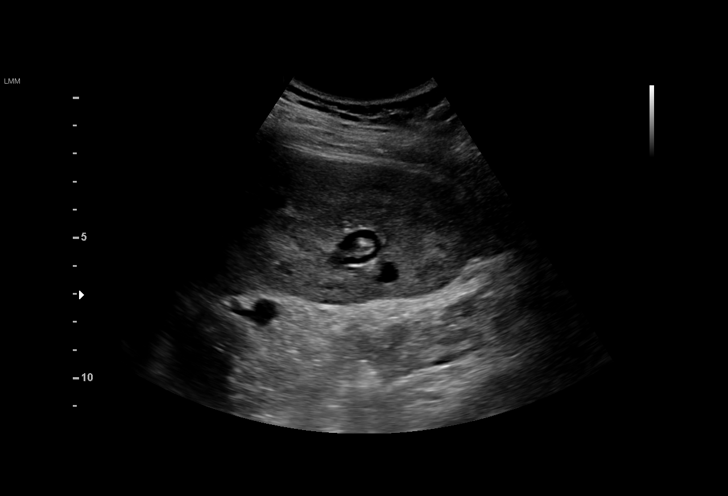
[im 11/53]
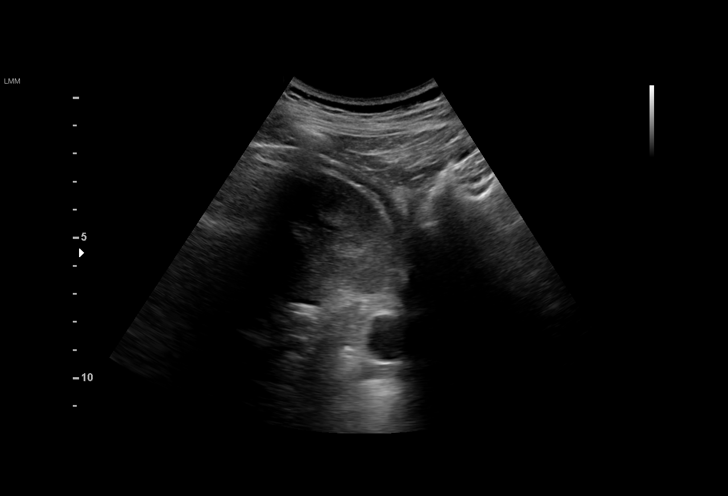
[im 16/53]
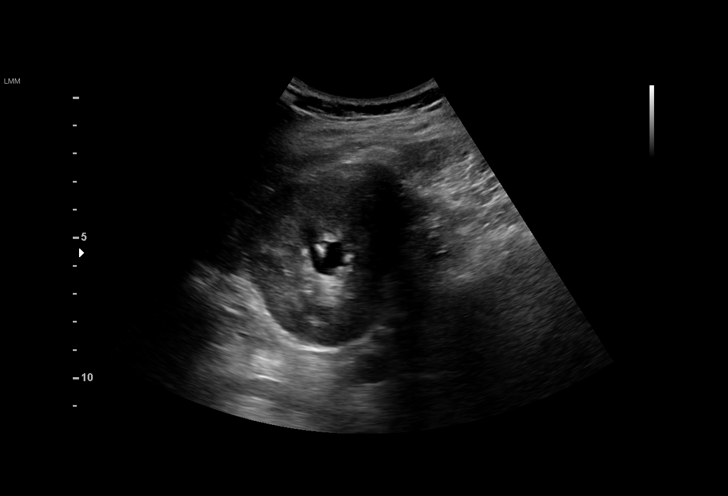
[im 20/53]
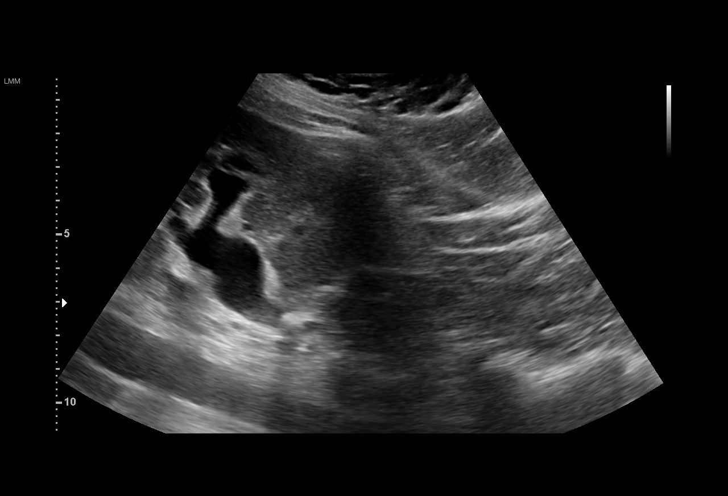
[im 22/53]
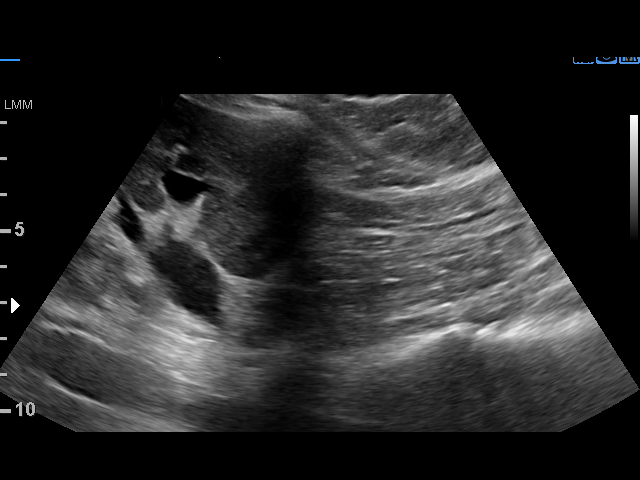
[im 27/53]
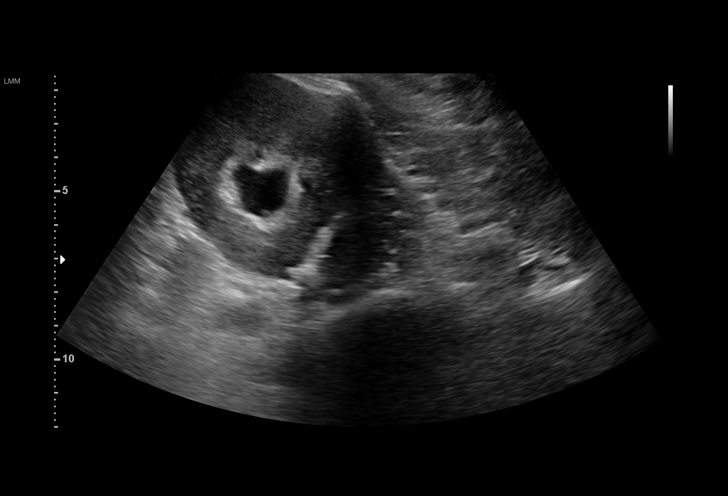
[im 31/53]
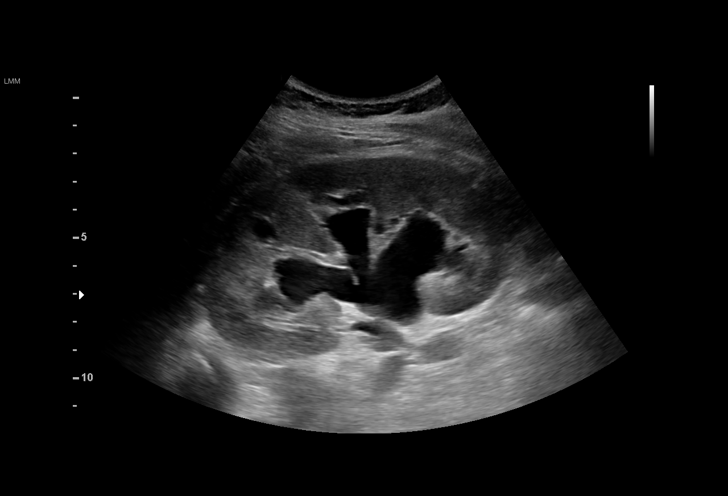
[im 33/53]
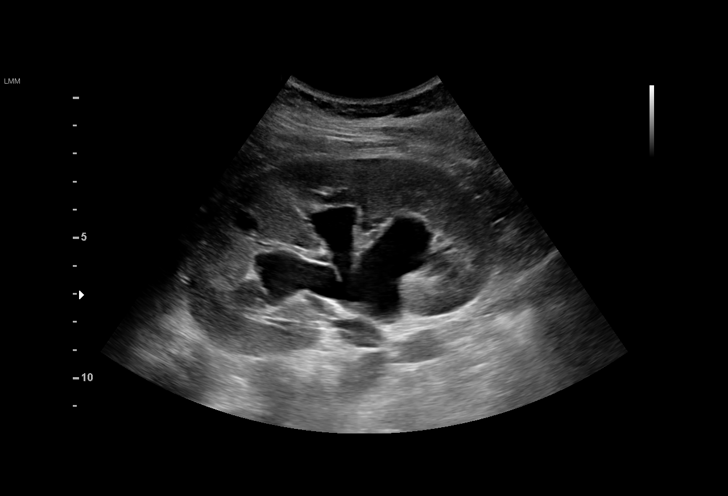
[im 37/53]
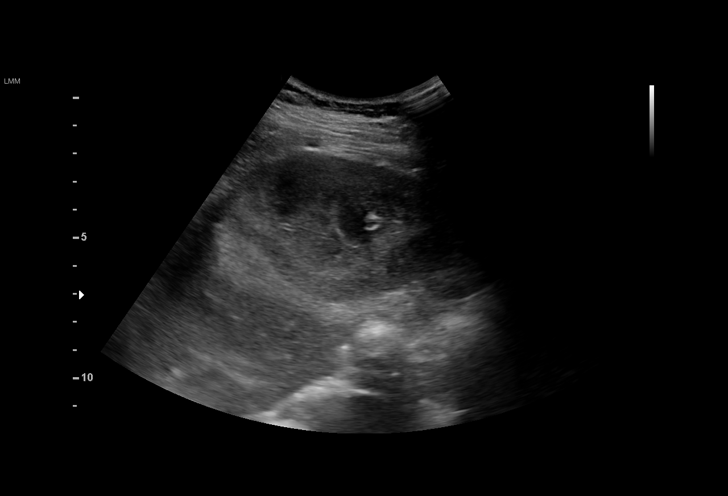
[im 42/53]
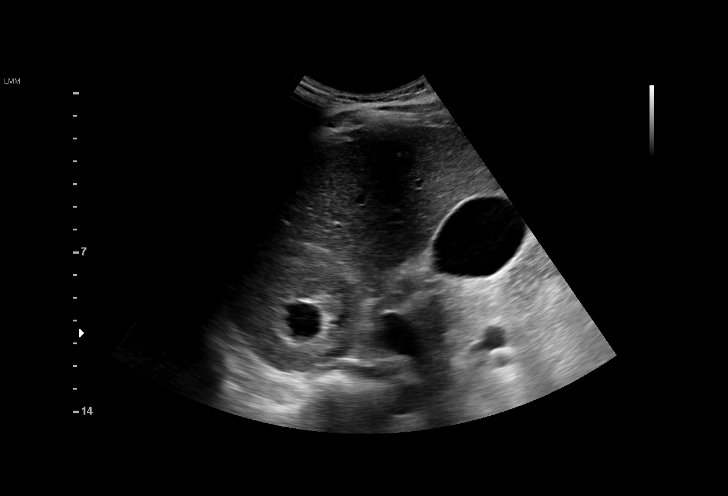
[im 44/53]
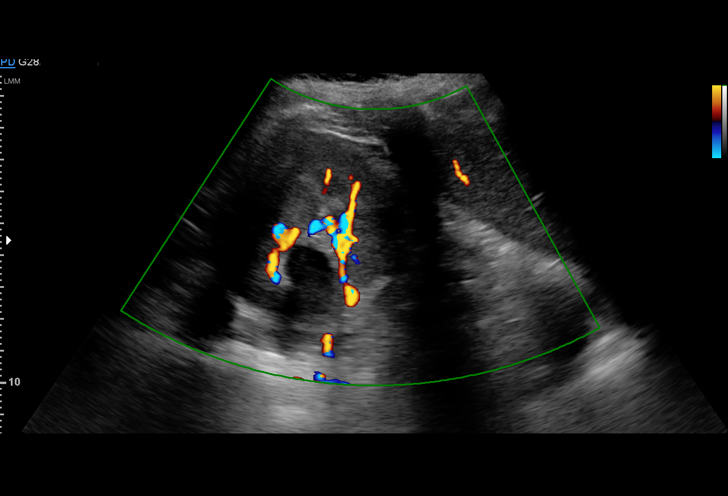
[im 48/53]
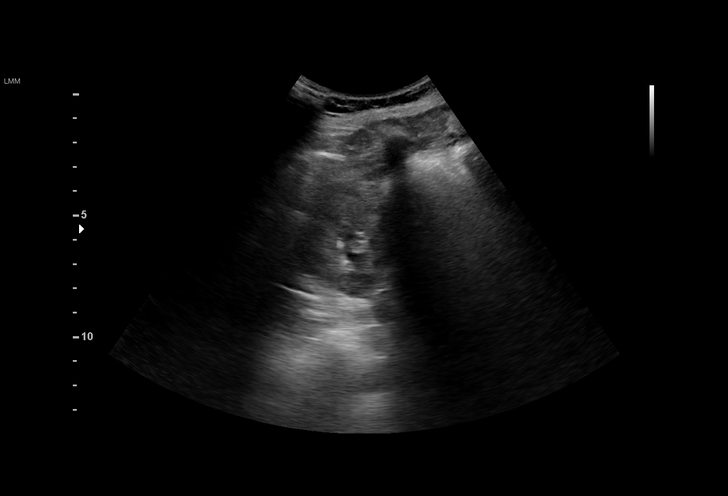
[im 53/53]
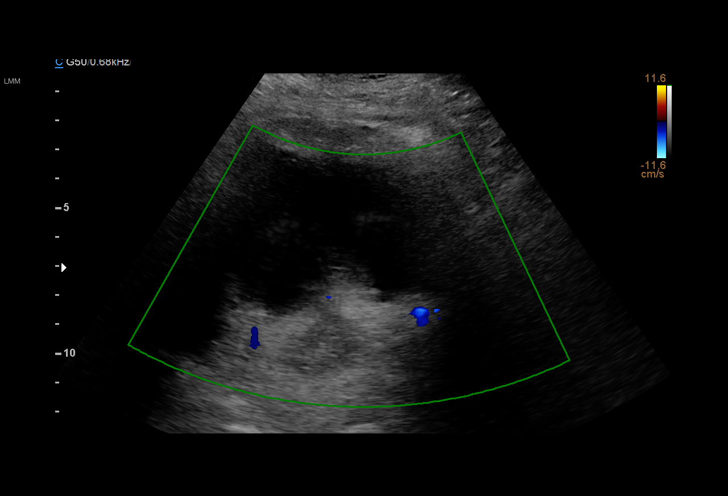

[15 of 25 positions shown; findings below may reference images not displayed]

FINDINGS: Right Kidney:

Length: 11.3 cm. Echogenicity within normal limits. Moderate
right-sided hydronephrosis is noted. No mass visualized.

Left Kidney:

Length: 11.7 cm. Echogenicity within normal limits. Moderate
left-sided hydronephrosis is noted. There appears to be a large 9 mm
stone at the proximal left ureter, just below the left ureteropelvic
junction. No mass visualized.

Bladder:

Appears normal for degree of bladder distention.
IMPRESSION: Moderate bilateral hydronephrosis noted.

Left-sided hydronephrosis appears to be caused by a large
obstructing 9 mm stone at the proximal left ureter, just below the
left ureteropelvic junction. Right-sided hydronephrosis may reflect
hydronephrosis of pregnancy. The patient's symptoms are replicated
while scanning the left kidney.
